# Patient Record
Sex: Male | Born: 1995 | Race: White | Hispanic: No | Marital: Single | Smoking: Current every day smoker
Health system: Southern US, Community
[De-identification: ages and names within clinical notes are randomized; demographics above are authoritative.]

---
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
## 2018-11-13 ENCOUNTER — Emergency Department (HOSPITAL_COMMUNITY)
Admission: EM | Admit: 2018-11-13 | Discharge: 2018-11-14 | Disposition: A | Discharge status: Home / Self Care | Payer: Medicaid - Out of State | Attending: Emergency Medicine | Admitting: Emergency Medicine

## 2018-11-13 ENCOUNTER — Encounter (HOSPITAL_COMMUNITY): Payer: Self-pay | Admitting: Emergency Medicine

## 2018-11-13 ENCOUNTER — Other Ambulatory Visit: Payer: Self-pay

## 2018-11-13 DIAGNOSIS — Y999 Unspecified external cause status: Secondary | ICD-10-CM | POA: Insufficient documentation

## 2018-11-13 DIAGNOSIS — F17210 Nicotine dependence, cigarettes, uncomplicated: Secondary | ICD-10-CM | POA: Insufficient documentation

## 2018-11-13 DIAGNOSIS — T25122A Burn of first degree of left foot, initial encounter: Secondary | ICD-10-CM | POA: Insufficient documentation

## 2018-11-13 DIAGNOSIS — Y92512 Supermarket, store or market as the place of occurrence of the external cause: Secondary | ICD-10-CM | POA: Insufficient documentation

## 2018-11-13 DIAGNOSIS — X100XXA Contact with hot drinks, initial encounter: Secondary | ICD-10-CM | POA: Insufficient documentation

## 2018-11-13 DIAGNOSIS — Y9389 Activity, other specified: Secondary | ICD-10-CM | POA: Insufficient documentation

## 2018-11-13 MED ORDER — ACETAMINOPHEN 325 MG PO TABS
650.0000 mg | ORAL_TABLET | Freq: Once | ORAL | Status: AC
  Administered 2018-11-14: 650 mg via ORAL
  Filled 2018-11-13: qty 2

## 2018-11-13 MED ORDER — SILVER SULFADIAZINE 1 % EX CREA
TOPICAL_CREAM | Freq: Once | CUTANEOUS | Status: AC
  Administered 2018-11-14: 1 via TOPICAL
  Filled 2018-11-13: qty 50

## 2018-11-13 NOTE — ED Provider Notes (Signed)
[facility]-EMERGENCY DEPT Provider Note   CSN: [id number] Arrival date & time: 11/13/18  2151    History   Chief Complaint Chief Complaint  Patient presents with  . Burn    HPI John Stevens is a 23 y.o. male was healthy presents emergency department today with chief complaint of burn.  This happened just prior to arrival.  [facility] and accidentally started to spill coffee on the floor.  It landed in his left shoe and burned his foot.  He rates the pain 7 out of 10 in severity.  Pain is localized to the burn and does not radiate.  He states his tetanus immunization is up-to-date. He did not take anything for pain prior to arrival.  History provided by patient with additional history obtained from chart review.      History reviewed. No pertinent past medical history.  There are no active problems to display for this patient.   History reviewed. No pertinent surgical history.      Home Medications    Prior to Admission medications   Not on File    Family History History reviewed. No pertinent family history.  Social History Social History   Tobacco Use  . Smoking status: Current Every Day Smoker    Packs/day: 0.50    Types: Cigarettes  . Smokeless tobacco: Never Used  Substance Use Topics  . Alcohol use: No  . Drug use: No     Allergies   Patient has no known allergies.   Review of Systems Review of Systems  Constitutional: Negative for chills and fever.  Musculoskeletal: Positive for arthralgias. Negative for joint swelling and myalgias.  Skin: Positive for wound.  Allergic/Immunologic: Negative for immunocompromised state.     Physical Exam Updated Vital Signs BP (!) 145/86 (BP Location: Right Arm)   Pulse 92   Temp 97.9 F (36.6 C) (Oral)   Resp 18   Ht 5\' 7"  (1.702 m)   Wt 59 kg   SpO2 99%   BMI 20.36 kg/m   Physical Exam Vitals signs and nursing note reviewed.   Constitutional:      Appearance: He is well-developed. He is not ill-appearing or toxic-appearing.  HENT:     Head: Normocephalic and atraumatic.     Nose: Nose normal.  Eyes:     General: No scleral icterus.       Right eye: No discharge.        Left eye: No discharge.     Conjunctiva/sclera: Conjunctivae normal.  Neck:     Musculoskeletal: Normal range of motion.     Vascular: No JVD.  Cardiovascular:     Rate and Rhythm: Normal rate and regular rhythm.     Pulses: Normal pulses.     Heart sounds: Normal heart sounds.  Pulmonary:     Effort: Pulmonary effort is normal.     Breath sounds: Normal breath sounds.  Abdominal:     General: There is no distension.  Musculoskeletal: Normal range of motion.     Comments: Full range of motion of left ankle.  Able to wiggle all toes toes.  Skin:    Capillary Refill: Capillary refill takes less than 2 seconds.     Comments: 1% BSA burn to to dorsal aspect of left foot and medial ankle with blisters forming.  Neurological:     Mental Status: He is oriented to person, place, and time.     GCS: GCS eye  subscore is 4. GCS verbal subscore is 5. GCS motor subscore is 6.     Comments: Fluent speech, no facial droop.  Psychiatric:        Behavior: Behavior normal.      ED Treatments / Results  Labs (all labs ordered are listed, but only abnormal results are displayed) Labs Reviewed - No data to display  EKG None  Radiology No results found.  Procedures Procedures (including critical care time)  Medications Ordered in ED Medications  silver sulfADIAZINE (SILVADENE) 1 % cream (1 application Topical Given 11/14/18 0001)  acetaminophen (TYLENOL) tablet 650 mg (650 mg Oral Given 11/14/18 0001)     Initial Impression / Assessment and Plan / ED Course  I have reviewed the triage vital signs and the nursing notes.  Pertinent labs & imaging results that were available during my care of the patient were reviewed by me and considered  in my medical decision making (see chart for details).  23 year old male presents with burn to left foot. He otherwise well-appearing, no acute distress. Approximately 1& body surface area burned with blisters already forming. Will provide wound care, apply silvadene, bulky dressing and post op shoe. Tetanus is up to date. Patient is comfortable with above plan and is stable for discharge at this time. All questions were answered prior to disposition.  Strict return precautions for returning to the ED were discussed. Encouraged follow up with PCP. Provided patient with a list of clinic resources to use if they do not have a PCP. Instructed to call them today to arrange follow-up in the next 24-48 hours.   This note was prepared using Dragon voice recognition software and may include unintentional dictation errors due to the inherent limitations of voice recognition software.    Final Clinical Impressions(s) / ED Diagnoses   Final diagnoses:  Superficial burn of left foot, initial encounter    ED Discharge Orders    None       Flint Melter 11/14/18 Tamela Oddi, April, MD 11/14/18 [phone]

## 2018-11-13 NOTE — ED Triage Notes (Signed)
Pt reports burning left foot with coffee several hours ago. Site bandaged at this time by EMS.

## 2018-11-13 NOTE — Discharge Instructions (Addendum)
You have been seen today for burn of left foot. Please read and follow all provided instructions. Return to the emergency room for worsening condition or new concerning symptoms.    1. Medications:  -Continue to clean the wound daily by rinsing with water.  You can apply the Silvadene cream you are given today in the emergency department and cover it with non stick gauze until it starts to heal.  2. Treatment: rest, drink plenty of fluids. Wear the shoe given to you in the hospital today or losing fitting shoes until the foot feels better and can fit in a shoe.  3. Follow Up: Please follow up with your primary doctor in 2-5 days for discussion of your diagnoses and further evaluation after today's visit; Call today to arrange your follow up.  If you do not have a primary care doctor use the resource guide provided to find one;   It is also a possibility that you have an allergic reaction to any of the medicines that you have been prescribed - Everybody reacts differently to medications and while MOST people have no trouble with most medicines, you may have a reaction such as nausea, vomiting, rash, swelling, shortness of breath. If this is the case, please stop taking the medicine immediately and contact your physician.  ?
